# Patient Record
Sex: Male | Born: 1990 | Race: Black or African American | Hispanic: No | Marital: Single | Smoking: Never smoker
Health system: Southern US, Community
[De-identification: ages and names within clinical notes are randomized; demographics above are authoritative.]

## PROBLEM LIST (undated history)

## (undated) ENCOUNTER — Emergency Department: Payer: Federal, State, Local not specified - PPO | Source: Home / Self Care

---

## 2013-01-30 ENCOUNTER — Emergency Department (HOSPITAL_COMMUNITY): Payer: Federal, State, Local not specified - PPO

## 2013-01-30 ENCOUNTER — Encounter (HOSPITAL_COMMUNITY): Payer: Self-pay | Admitting: Emergency Medicine

## 2013-01-30 ENCOUNTER — Emergency Department (HOSPITAL_COMMUNITY)
Admission: EM | Admit: 2013-01-30 | Discharge: 2013-01-30 | Disposition: A | Discharge status: Home / Self Care | Payer: Federal, State, Local not specified - PPO | Attending: Emergency Medicine | Admitting: Emergency Medicine

## 2013-01-30 DIAGNOSIS — Z79899 Other long term (current) drug therapy: Secondary | ICD-10-CM | POA: Insufficient documentation

## 2013-01-30 DIAGNOSIS — M79671 Pain in right foot: Secondary | ICD-10-CM

## 2013-01-30 DIAGNOSIS — X58XXXA Exposure to other specified factors, initial encounter: Secondary | ICD-10-CM | POA: Insufficient documentation

## 2013-01-30 DIAGNOSIS — Y929 Unspecified place or not applicable: Secondary | ICD-10-CM | POA: Insufficient documentation

## 2013-01-30 DIAGNOSIS — Z791 Long term (current) use of non-steroidal anti-inflammatories (NSAID): Secondary | ICD-10-CM | POA: Insufficient documentation

## 2013-01-30 DIAGNOSIS — Y9361 Activity, american tackle football: Secondary | ICD-10-CM | POA: Insufficient documentation

## 2013-01-30 DIAGNOSIS — S8990XA Unspecified injury of unspecified lower leg, initial encounter: Secondary | ICD-10-CM | POA: Insufficient documentation

## 2013-01-30 MED ORDER — MELOXICAM 15 MG PO TABS: 15.0000 mg | ORAL_TABLET | Freq: Every day | ORAL | Status: AC

## 2013-01-30 NOTE — ED Provider Notes (Signed)
CSN: [id number]     Arrival date & time 01/30/13  1931 History  This chart was scribed for non-physician practitioner, Arthor Captain, PA-C, working with Toy Baker, MD by Clydene Laming, ED Scribe. This patient was seen in room TR11C/TR11C and the patient's care was started at 8:15 PM.   Chief Complaint  Patient presents with  . Foot Pain    The history is provided by the patient. No language interpreter was used.    HPI Comments: Dustin Nunez is a 22 y.o. male who presents to the Emergency Department complaining of constant, moderate right heel pain onset 2 weeks ago. Pt states that he was at football practice, he jumped and landed "wrong" with all of his weight on is right heel. He reports associated swelling to the foot after the injury which he states has mostly subsided. He states that he has not had X-rays and has not had this pain evaluated, other than by his trainer, who gave him a boot to wear. Pt states that he took his boot off yesterday and his heel was still hurting. He states that he is able to walk and bear weight  on the right foot, but he has pain with running and walking up stairs. He denies any numbness or tingling to the toes on his right foot.   History reviewed. No pertinent past medical history. No past surgical history on file. No family history on file. History  Substance Use Topics  . Smoking status: Never Smoker   . Smokeless tobacco: Not on file  . Alcohol Use: No    Review of Systems A complete 10 system review of systems was obtained and all systems are negative except as noted in the HPI and PMH.   Allergies  Review of patient's allergies indicates no known allergies.  Home Medications   Current Outpatient Rx  Name  Route  Sig  Dispense  Refill  . mometasone (NASONEX) 50 MCG/ACT nasal spray   Nasal   Place 2 sprays into the nose daily.         . meloxicam (MOBIC) 15 MG tablet   Oral   Take 1 tablet (15 mg total) by mouth daily. Take 1 daily  with food.   10 tablet   0     Triage Vitals: BP 126/72  Pulse 61  Temp(Src) 98.8 F (37.1 C) (Oral)  Resp 14  SpO2 97%  Physical Exam  Nursing note and vitals reviewed. Constitutional: He is oriented to person, place, and time. He appears well-developed and well-nourished. No distress.  HENT:  Head: Normocephalic.  Eyes: EOM are normal.  Neck: Neck supple. No tracheal deviation present.  Cardiovascular: Normal rate and intact distal pulses.   Pulmonary/Chest: Effort normal. No respiratory distress.  Musculoskeletal: Normal range of motion.  Negative Thompson's test. Tenderness to palpation over the right calcaneus laterally and medially.   Neurological: He is alert and oriented to person, place, and time.  DP pulse intact for right foot. NVI.  Skin: Skin is warm and dry.  No bruising or swelling of the right foot.  Psychiatric: He has a normal mood and affect. His behavior is normal.    ED Course  Procedures (including critical care time)  DIAGNOSTIC STUDIES: Oxygen Saturation is 97% on RA, normal by my interpretation.    COORDINATION OF CARE: 8:22 PM- Discussed plan for diagnostic radiology of pt's right foot with pt at bedside. Pt verbalized understanding and agreement with plan.   Labs Review Labs  Reviewed - No data to display Imaging Review Dg Os Calcis Right  01/30/2013   *RADIOLOGY REPORT*  Clinical Data: Foot pain.  Injury to the healed.  RIGHT OS CALCIS - 2+ VIEW  Comparison: No priors.  Findings: Three views of the right calcaneus demonstrate no acute displaced fracture.  Marked soft tissue swelling posterior and inferior to the calcaneus is noted.  IMPRESSION: 1.  Marked soft tissue swelling along the plantar aspect of the posterior foot extending posterior to the calcaneus, without evidence of underlying displaced fracture.   Original Report Authenticated By: Trudie Reed, M.D.    MDM   1. Heel pain, right    Patient with markedly soft tissue swelling  on os calcis view of the right heel.  Is likely due to hematoma.  No evidence of fracture on plain film.  The patient will be discharged and started on Mobic one daily for the next 10 days with icing 2-3 times daily.  He may continue with his football practices as tolerated.  He may follow up with orthopedics for continued pain or worsening symptoms. The patient appears reasonably screened and/or stabilized for discharge and I doubt any other medical condition or other [facility] requiring further screening, evaluation, or treatment in the ED at this time prior to discharge.    I personally performed the services described in this documentation, which was scribed in my presence. The recorded information has been reviewed and is accurate.     Arthor Captain, PA-C 02/01/13 1830

## 2013-01-30 NOTE — ED Notes (Signed)
Pt.reports right heel pain injured while playing football 2 weeks ago . Ambulatory.

## 2013-01-30 NOTE — ED Notes (Signed)
Patient transported to X-ray 

## 2013-02-02 NOTE — ED Provider Notes (Signed)
Medical screening examination/treatment/procedure(s) were performed by non-physician practitioner and as supervising physician I was immediately available for consultation/collaboration.  Coutney Wildermuth T Jamecia Lerman, MD 02/02/13 0802 

## 2014-01-16 IMAGING — CR DG OS CALCIS 2+V*R*
1 series · 1 of 1 positions shown · non-contrast
Comparison: No priors.

CLINICAL DATA: Foot pain.  Injury to the healed.

RIGHT OS CALCIS - 2+ VIEW

[t calcaneus lat right]
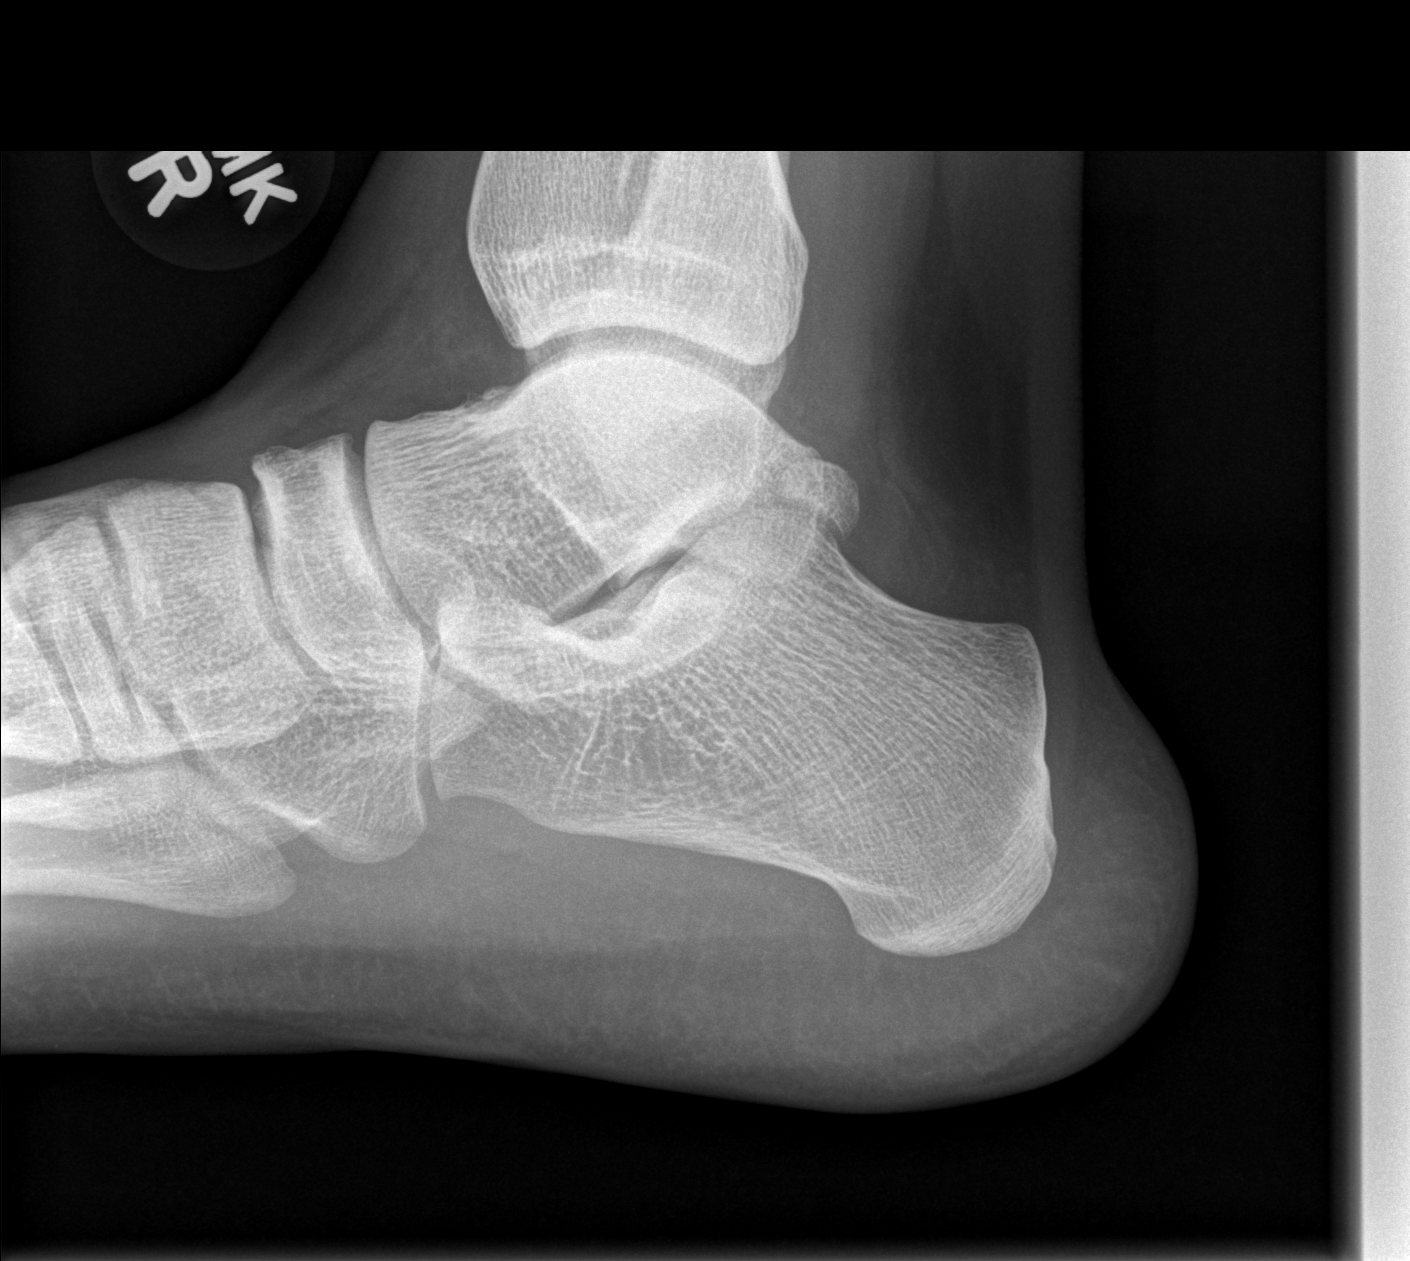

[1 of 1 positions shown; findings below may reference images not displayed]

FINDINGS: Three views of the right calcaneus demonstrate no acute
displaced fracture.  Marked soft tissue swelling posterior and
inferior to the calcaneus is noted.
IMPRESSION: 1.  Marked soft tissue swelling along the plantar aspect of the
posterior foot extending posterior to the calcaneus, without
evidence of underlying displaced fracture.

## 2018-02-21 NOTE — Progress Notes (Signed)
Tawana ScaleZach Smith D.O. [facility] [address] [place], [place] [address] Phone: [phone] Subjective:   Dustin Nunez, Valerie Wolf, am serving as a scribe for Dr. Antoine PrimasZachary Smith. CC: Left knee pain  QMV:[id number]:Subjective  Dustin GibsonKeenan Vidovich is a 27 y.o. male coming in with complaint of left knee pain. He tore his meniscus and MCL one year ago. Was told that he didn't need surgery and that it would heal on its own. Has felt instability since injury. Does play basketball and flag football. Does have a popping sensation in the knee and it does catch occasionally. Does wear a sleeve when playing sports. Did try to do some therapy on his own.  Patient did have an MRI 2 years ago.  I did get the report.  This report shows that patient did have a high-grade MCL tear as well as a medial meniscal tear.  Seem to be peripheral.     History reviewed. No pertinent past medical history. History reviewed. No pertinent surgical history. Social History   Socioeconomic History  . Marital status: Single    Spouse name: Not on file  . Number of children: Not on file  . Years of education: Not on file  . Highest education level: Not on file  Occupational History  . Not on file  Social Needs  . Financial resource strain: Not on file  . Food insecurity:    Worry: Not on file    Inability: Not on file  . Transportation needs:    Medical: Not on file    Non-medical: Not on file  Tobacco Use  . Smoking status: Never Smoker  Substance and Sexual Activity  . Alcohol use: No  . Drug use: No  . Sexual activity: Not on file  Lifestyle  . Physical activity:    Days per week: Not on file    Minutes per session: Not on file  . Stress: Not on file  Relationships  . Social connections:    Talks on phone: Not on file    Gets together: Not on file    Attends religious service: Not on file    Active member of club or organization: Not on file    Attends meetings of clubs or organizations: Not on file    Relationship  status: Not on file  Other Topics Concern  . Not on file  Social History Narrative  . Not on file   No Known Allergies History reviewed. No pertinent family history.   Current Outpatient Medications (Cardiovascular):  .  nitroGLYCERIN (NITRODUR - DOSED IN MG/24 HR) 0.2 mg/hr patch, 1/4 patch daily  Current Outpatient Medications (Respiratory):  .  mometasone (NASONEX) 50 MCG/ACT nasal spray, Place 2 sprays into the nose daily.  Current Outpatient Medications (Analgesics):  .  meloxicam (MOBIC) 15 MG tablet, Take 1 tablet (15 mg total) by mouth daily. Take 1 daily with food.   Current Outpatient Medications (Other):  Marland Kitchen.  Vitamin D, Ergocalciferol, (DRISDOL) 50000 units CAPS capsule, Take 1 capsule (50,000 Units total) by mouth every 7 (seven) days.    Past medical history, social, surgical and family history all reviewed in electronic medical record.  No pertanent information unless stated regarding to the chief complaint.   Review of Systems:  No headache, visual changes, nausea, vomiting, diarrhea, constipation, dizziness, abdominal pain, skin rash, fevers, chills, night sweats, weight loss, swollen lymph nodes, body aches, joint swelling, muscle aches, chest pain, shortness of breath, mood changes.   Objective  Blood pressure  110/82, pulse (!) 59, height 5\' 9"  (1.753 m), weight 171 lb (77.6 kg), SpO2 97 %.    General: No apparent distress alert and oriented x3 mood and affect normal, dressed appropriately.  HEENT: Pupils equal, extraocular movements intact  Respiratory: Patient's speak in full sentences and does not appear short of breath  Cardiovascular: No lower extremity edema, non tender, no erythema  Skin: Warm dry intact with no signs of infection or rash on extremities or on axial skeleton.  Abdomen: Soft nontender  Neuro: Cranial nerves II through XII are intact, neurovascularly intact in all extremities with 2+ DTRs and 2+ pulses.  Lymph: No lymphadenopathy of  posterior or anterior cervical chain or axillae bilaterally.  Gait normal with good balance and coordination.  MSK:  Non tender with full range of motion and good stability and symmetric strength and tone of shoulders, elbows, wrist, hip, and ankles bilaterally.  Knee: Normal to inspection with no erythema or effusion or obvious bony abnormalities. Mild tenderness over the medial joint line ROM full in flexion and extension and lower leg rotation. Ligaments with solid consistent endpoints including ACL, PCL, LCL, MCL. Negative Mcmurray's, Apley's, and Thessalonian tests. Non painful patellar compression. Patellar glide with minimal crepitus. Patellar and quadriceps tendons unremarkable. Hamstring and quadriceps strength is normal. Contralateral knee unremarkable  MSK US performed of: Left knee This study was ordered, performed, and interpreted by Terrilee Files D.O.  Knee: All structures visualized. 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  Seems to go away towards the tear and go intra-articular.  No true mass appreciated though. Patellar Tendon unremarkable on long and transverse views without effusion. No abnormality of prepatellar bursa. MCL with chronic tearing, posterior fibers on the tibial side does have some mild chronic tear noted but less than 10% of the tendon  IMPRESSION: Medial meniscus tear   Impression and Recommendations:     This case required medical decision making of moderate complexity. The above documentation has been reviewed and is accurate and complete Judi Saa, DO       Note: This dictation was prepared with Dragon dictation along with smaller phrase technology. Any transcriptional errors that result from this process are unintentional.

## 2018-02-22 ENCOUNTER — Ambulatory Visit: Payer: Self-pay

## 2018-02-22 ENCOUNTER — Encounter: Payer: Self-pay | Admitting: Family Medicine

## 2018-02-22 ENCOUNTER — Ambulatory Visit: Payer: Federal, State, Local not specified - PPO | Admitting: Family Medicine

## 2018-02-22 VITALS — BP 110/82 | HR 59 | Ht 69.0 in | Wt 171.0 lb

## 2018-02-22 DIAGNOSIS — M25562 Pain in left knee: Secondary | ICD-10-CM | POA: Diagnosis not present

## 2018-02-22 DIAGNOSIS — G8929 Other chronic pain: Secondary | ICD-10-CM

## 2018-02-22 DIAGNOSIS — M23207 Derangement of unspecified meniscus due to old tear or injury, left knee: Secondary | ICD-10-CM

## 2018-02-22 MED ORDER — VITAMIN D (ERGOCALCIFEROL) 1.25 MG (50000 UNIT) PO CAPS: 50000.0000 [IU] | ORAL_CAPSULE | ORAL | 0 refills | Status: AC

## 2018-02-22 MED ORDER — NITROGLYCERIN 0.2 MG/HR TD PT24: MEDICATED_PATCH | TRANSDERMAL | 1 refills | Status: AC

## 2018-02-22 NOTE — Patient Instructions (Addendum)
Good to se you  Ice 20 minutes 2 times daily. Usually after activity and before bed. Exercises 3 times a week.  Still avoid twisting or cutting Once weekly vitamin D for 2-3 months Nitroglycerin Protocol   Apply 1/4 nitroglycerin patch to affected area daily.  Change position of patch within the affected area every 24 hours.  You may experience a headache during the first 1-2 weeks of using the patch, these should subside.  If you experience headaches after beginning nitroglycerin patch treatment, you may take your preferred over the counter pain reliever.  Another side effect of the nitroglycerin patch is skin irritation or rash related to patch adhesive.  Please notify our office if you develop more severe headaches or rash, and stop the patch.  Tendon healing with nitroglycerin patch may require 12 to 24 weeks depending on the extent of injury.  Men should not use if taking Viagra, Cialis, or Levitra.   Do not use if you have migraines or rosacea.  See em again in 5-6 weeks

## 2018-02-22 NOTE — Assessment & Plan Note (Signed)
Potentially chronic tear.  Patient does have some increased vascularity in the area.  I believe that this could be in the way of patient healing appropriately.  Discussed icing regimen and home exercises.  Discussed which activities to do which wants to avoid.  Patient will follow-up with me again 4 weeks.  Worsening symptoms consider injection or formal physical therapy

## 2018-03-29 ENCOUNTER — Ambulatory Visit: Payer: Federal, State, Local not specified - PPO | Admitting: Family Medicine

## 2018-04-12 ENCOUNTER — Ambulatory Visit: Payer: Federal, State, Local not specified - PPO | Admitting: Family Medicine
